# Patient Record
Sex: Female | Born: 1990 | Race: White | Hispanic: No | Marital: Single
Health system: Southern US, Community
[De-identification: ages and names within clinical notes are randomized; demographics above are authoritative.]

---
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
## 2013-03-06 DIAGNOSIS — M549 Dorsalgia, unspecified: Secondary | ICD-10-CM | POA: Insufficient documentation

## 2013-03-06 DIAGNOSIS — Z9109 Other allergy status, other than to drugs and biological substances: Secondary | ICD-10-CM | POA: Insufficient documentation

## 2013-03-06 DIAGNOSIS — G8929 Other chronic pain: Secondary | ICD-10-CM | POA: Insufficient documentation

## 2013-03-06 DIAGNOSIS — G43909 Migraine, unspecified, not intractable, without status migrainosus: Secondary | ICD-10-CM | POA: Insufficient documentation

## 2013-04-08 DIAGNOSIS — R062 Wheezing: Secondary | ICD-10-CM | POA: Insufficient documentation

## 2015-12-25 DIAGNOSIS — E042 Nontoxic multinodular goiter: Secondary | ICD-10-CM | POA: Insufficient documentation

## 2017-03-15 ENCOUNTER — Other Ambulatory Visit: Payer: Self-pay | Admitting: Orthopaedic Surgery

## 2017-03-15 DIAGNOSIS — M542 Cervicalgia: Secondary | ICD-10-CM

## 2017-03-15 DIAGNOSIS — M546 Pain in thoracic spine: Secondary | ICD-10-CM

## 2017-04-03 ENCOUNTER — Ambulatory Visit
Admission: RE | Admit: 2017-04-03 | Discharge: 2017-04-03 | Disposition: A | Discharge status: Home / Self Care | Payer: BLUE CROSS/BLUE SHIELD | Source: Ambulatory Visit | Attending: Orthopaedic Surgery | Admitting: Orthopaedic Surgery

## 2017-04-03 ENCOUNTER — Encounter: Payer: Self-pay | Admitting: Radiology

## 2017-04-03 VITALS — BP 107/67 | HR 62

## 2017-04-03 DIAGNOSIS — M546 Pain in thoracic spine: Secondary | ICD-10-CM

## 2017-04-03 DIAGNOSIS — M4120 Other idiopathic scoliosis, site unspecified: Secondary | ICD-10-CM

## 2017-04-03 DIAGNOSIS — M542 Cervicalgia: Secondary | ICD-10-CM

## 2017-04-03 MED ORDER — DIAZEPAM 5 MG PO TABS
10.0000 mg | ORAL_TABLET | Freq: Once | ORAL | Status: AC
  Administered 2017-04-03: 10 mg via ORAL

## 2017-04-03 MED ORDER — OXYCODONE-ACETAMINOPHEN 5-325 MG PO TABS
1.0000 | ORAL_TABLET | Freq: Once | ORAL | Status: AC
  Administered 2017-04-03: 1 via ORAL

## 2017-04-03 MED ORDER — ONDANSETRON HCL 4 MG/2ML IJ SOLN: 4.0000 mg | Freq: Four times a day (QID) | INTRAMUSCULAR | Status: DC | PRN

## 2017-04-03 MED ORDER — IOPAMIDOL (ISOVUE-M 300) INJECTION 61%
10.0000 mL | Freq: Once | INTRAMUSCULAR | Status: AC | PRN
  Administered 2017-04-03: 10 mL via INTRATHECAL

## 2017-04-03 NOTE — Discharge Instructions (Signed)
Myelogram Discharge Instructions  1. Go home and rest quietly for the next 24 hours.  It is important to lie flat for the next 24 hours.  Get up only to go to the restroom.  You may lie in the bed or on a couch on your back, your stomach, your left side or your right side.  You may have one pillow under your head.  You may have pillows between your knees while you are on your side or under your knees while you are on your back.  2. DO NOT drive today.  Recline the seat as far back as it will go, while still wearing your seat belt, on the way home.  3. You may get up to go to the bathroom as needed.  You may sit up for 10 minutes to eat.  You may resume your normal diet and medications unless otherwise indicated.  Drink lots of extra fluids today and tomorrow.  4. The incidence of headache, nausea, or vomiting is about 5% (one in 20 patients).  If you develop a headache, lie flat and drink plenty of fluids until the headache goes away.  Caffeinated beverages may be helpful.  If you develop severe nausea and vomiting or a headache that does not go away with flat bed rest, call [phone].  5. You may resume normal activities after your 24 hours of bed rest is over; however, do not exert yourself strongly or do any heavy lifting tomorrow. If when you get up you have a headache when standing, go back to bed and force fluids for another 24 hours.  6. Call your physician for a follow-up appointment.  The results of your myelogram will be sent directly to your physician by the following day.  7. If you have any questions or if complications develop after you arrive home, please call [phone].  Discharge instructions have been explained to the patient.  The patient, or the person responsible for the patient, fully understands these instructions.       May resume Maxalt on April 04, 2017, after 9:30 am.

## 2017-04-03 NOTE — Progress Notes (Signed)
Patient states she has been off Maxalt for at least the past two days.  Donell SievertJeanne Nox Talent, RN

## 2018-04-24 IMAGING — CT CT T SPINE W/ CM
2 series · 10 of 28 positions shown, 13 images · non-contrast
Comparison: None.

CLINICAL DATA: Upper thoracic spine pain located between the
shoulder blades. Cervicalgia. Occasional low back pain. Prior
thoracolumbar fusion.
TECHNIQUE: Contiguous axial images were obtained through the Cervical and
Thoracic spine after the intrathecal infusion of infusion. Coronal
and sagittal reconstructions were obtained of the axial image sets.

[Series 3: t spine soft · axial · 0.32mm/px · z∈[-439,-250]mm · 5 of 91 slices shown, 7 images]
[im 14/91  soft-tissue]
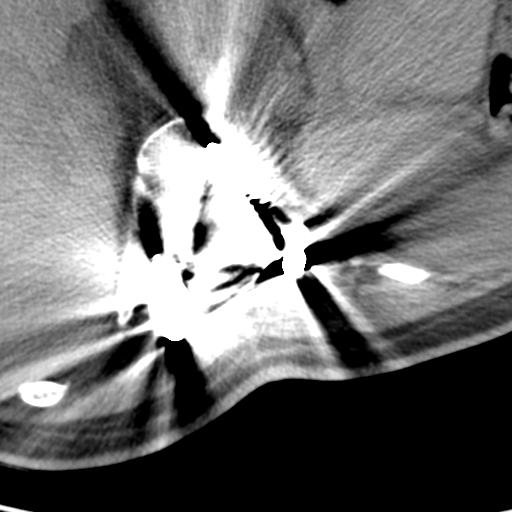
[im 14/91  bone]
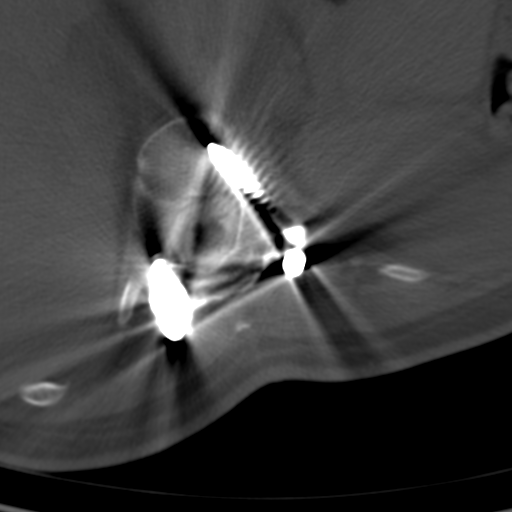
[im 28/91  bone]
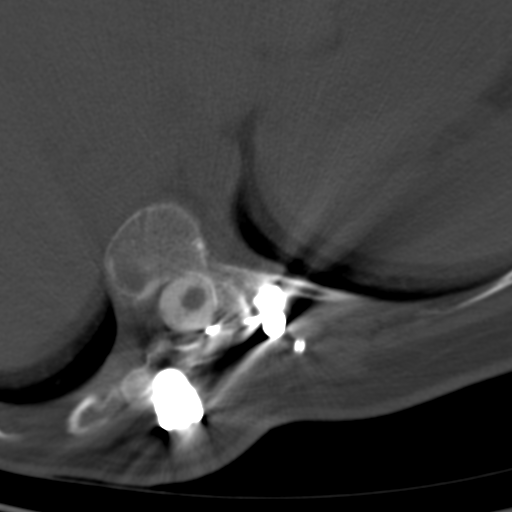
[im 49/91  bone]
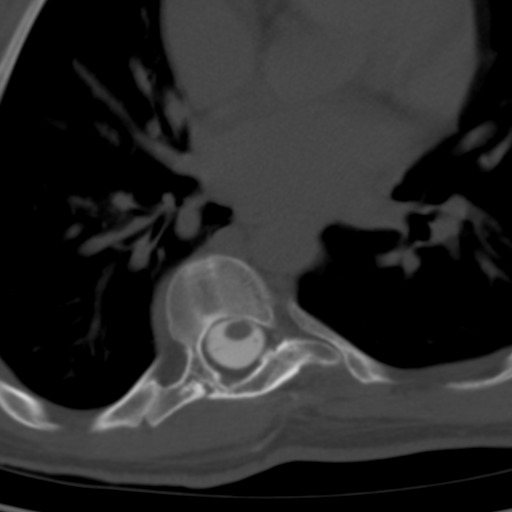
[im 63/91  bone]
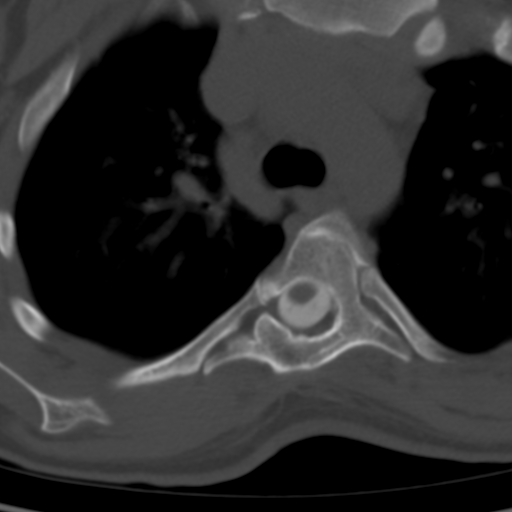
[im 77/91  soft-tissue]
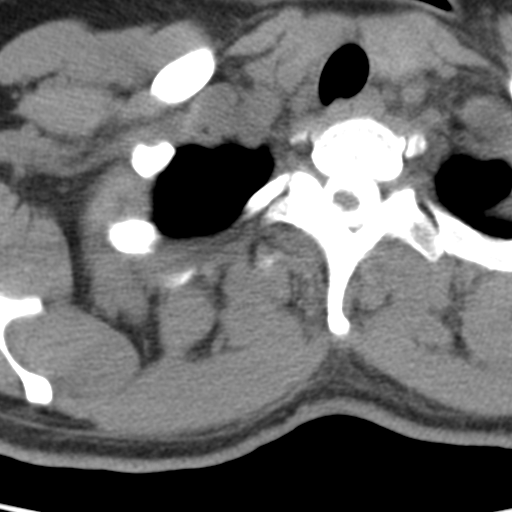
[im 77/91  bone]
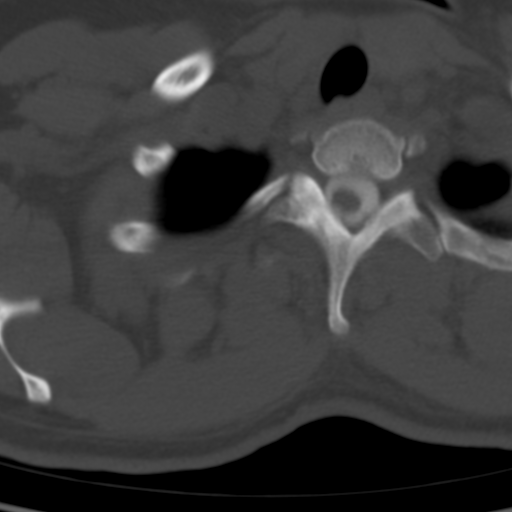

[Series 9: sag upper · sagittal · 0.33mm/px · 5 of 49 slices shown, 6 images]
[im 18/49  bone]
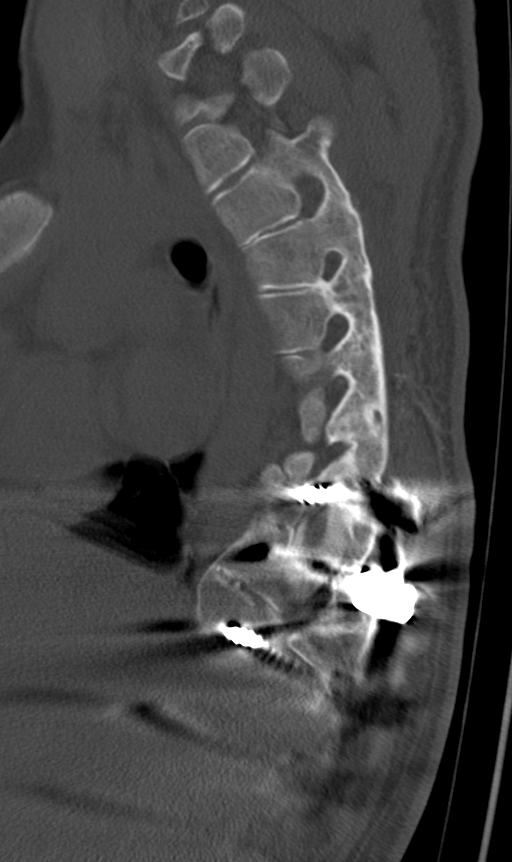
[im 21/49  bone]
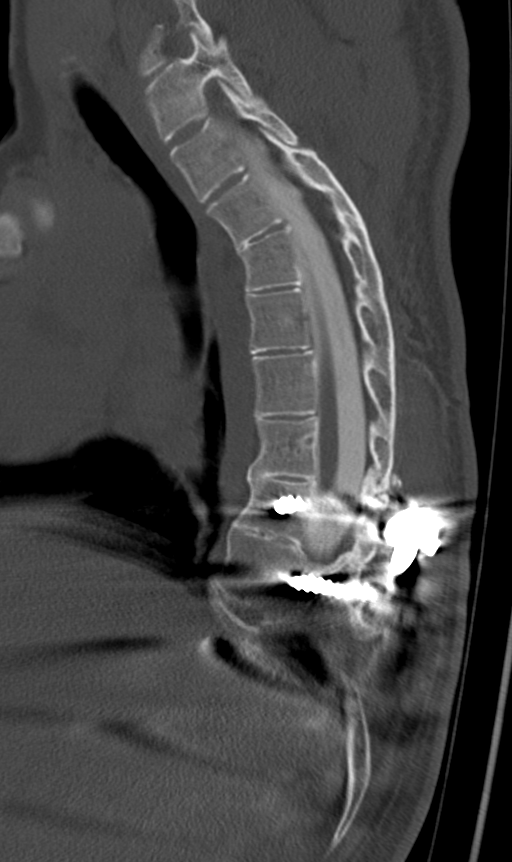
[im 25/49  soft-tissue]
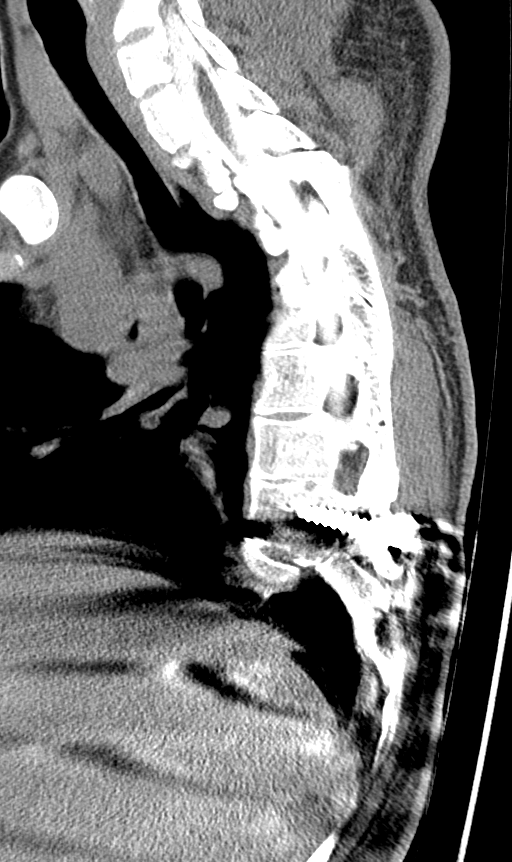
[im 25/49  bone]
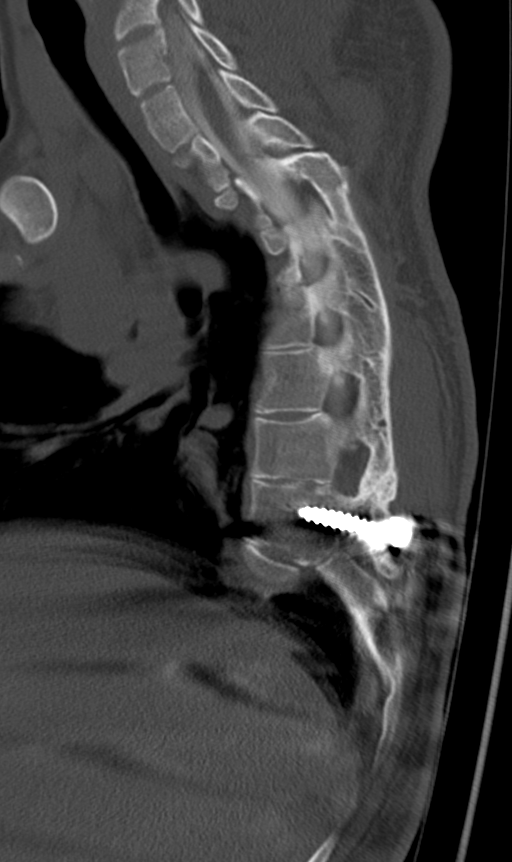
[im 28/49  bone]
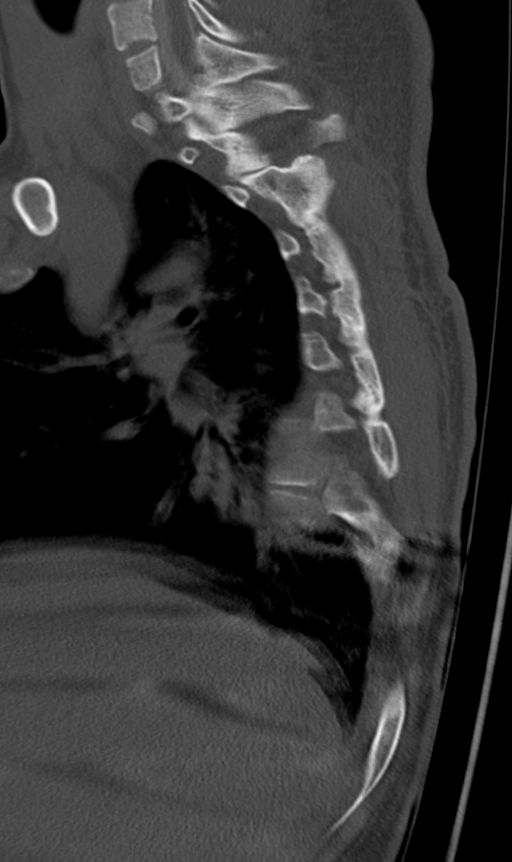
[im 31/49  bone]
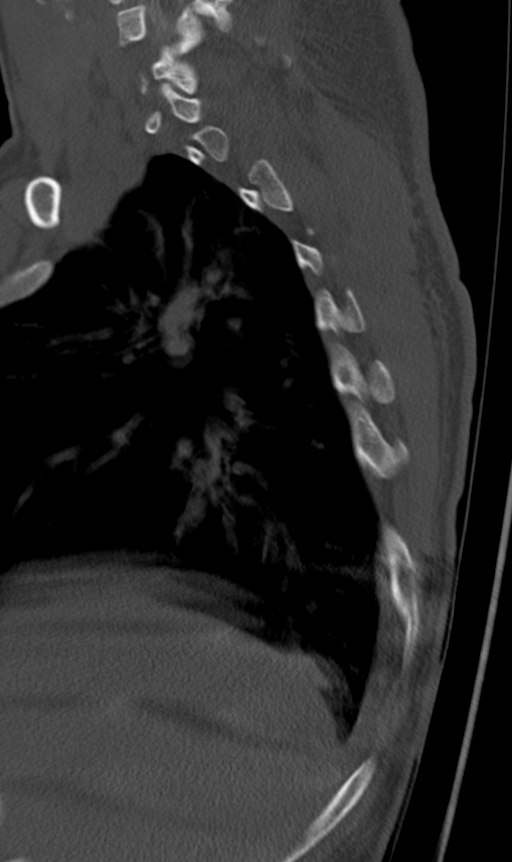

[10 of 28 positions shown; findings below may reference images not displayed]

FLUOROSCOPY TIME:  Radiation Exposure Index (as provided by the
fluoroscopic device): 501.21 microGray*m^2

Fluoroscopy Time (in minutes and seconds):  51 seconds

PROCEDURE:
LUMBAR PUNCTURE FOR CERVICAL AND THORACIC MYELOGRAM

After thorough discussion of risks and benefits of the procedure
including bleeding, infection, injury to nerves, blood vessels,
adjacent structures as well as headache and CSF leak, written and
oral informed consent was obtained. Consent was obtained by Dr.
Kasija Bernadette.

Patient was positioned prone on the fluoroscopy table. Local
anesthesia was provided with 1% lidocaine without epinephrine after
prepped and draped in the usual sterile fashion. Puncture was
performed at L3-4 using a 3 1/2 inch 22-gauge spinal needle via a
left interlaminar approach. Using a single pass through the dura,
the needle was placed within the thecal sac, with return of clear
CSF. 10 mL of Isovue-M 300 was injected into the thecal sac, with
normal opacification of the nerve roots and cauda equina consistent
with free flow within the subarachnoid space. The patient was then
moved to the trendelenburg position and contrast flowed into the
Thoracic spine region.

I personally performed the lumbar puncture and administered the
intrathecal contrast. I also personally supervised acquisition of
the myelogram images.
FINDINGS: CERVICAL AND THORACIC MYELOGRAM FINDINGS:

There is transitional lumbosacral anatomy with sacralization of L5.
Extensive posterior thoracolumbar spinal fusion has been performed.
Pedicle screws and interconnecting rods are in place from T8-L3.
Cervical vertebral alignment is normal. There is a severe thoracic
dextroscoliosis. The thecal sac appears widely patent in the
thoracic spine. Detailed assessment of the cervical spine is
deferred to the accompanying CT given relatively dilute contrast in
this region on the conventional myelographic images.

CT CERVICAL MYELOGRAM FINDINGS:

There is mild straightening of the normal cervical lordosis. There
is no significant listhesis. No fracture or destructive osseous
process is identified. Cervical spinal cord morphology is normal.
Paraspinal soft tissues are unremarkable.

There is minimal disc bulging and minimal endplate spurring at C5-6
without resultant spinal stenosis, neural foraminal stenosis, or
spinal cord mass effect. Other cervical disc space levels are
unremarkable.

CT THORACIC MYELOGRAM FINDINGS:

There is severe thoracic dextroscoliosis with apex at T8. Extensive
posterior thoracolumbar spinal fusion has been performed. There is
solid posterior element osseous fusion from T3 inferiorly throughout
the thoracic spine and extending into the lumbar spine (only imaged
to L1). Bilateral pedicle screws and interconnecting rods are
present from T8-L3 (L2 and L3 not included on this CT). There is no
evidence of pedicle screw loosening. Cerclage wires are noted around
the left lamina and rods at T9 and T10. Thoracic spinal cord
morphology is normal, and the conus medullaris terminates at T12.
The paraspinal soft tissues are unremarkable.

The thoracic spinal canal is widely patent, and no thoracic disc
herniation is identified. There is mild right facet arthrosis at
T1-2 resulting in mild right neural foraminal narrowing. Moderate
right facet arthrosis at T2-3 results in moderate right neural
foraminal stenosis.
IMPRESSION: 1. Minimal disc bulging at C5-6.  No cervical spinal stenosis.
2. Severe thoracolumbar dextroscoliosis. Solid posterior
thoracolumbar fusion (as imaged through the L1 level).
3. Widely patent thoracic spinal canal.
4. Right-sided upper thoracic facet arthrosis with mild right neural
foraminal narrowing at T1-2 and moderate right neural foraminal
narrowing at T2-3.

## 2018-04-24 IMAGING — CT CT CERVICAL SPINE W/ CM
2 series · 10 of 14 positions shown, 12 images · non-contrast
Comparison: None.

CLINICAL DATA: Upper thoracic spine pain located between the
shoulder blades. Cervicalgia. Occasional low back pain. Prior
thoracolumbar fusion.
TECHNIQUE: Contiguous axial images were obtained through the Cervical and
Thoracic spine after the intrathecal infusion of infusion. Coronal
and sagittal reconstructions were obtained of the axial image sets.

[Series 4: cspine soft · axial · 0.32mm/px · z∈[-246,-118]mm · 5 of 98 slices shown]
[im 17/98  soft-tissue]
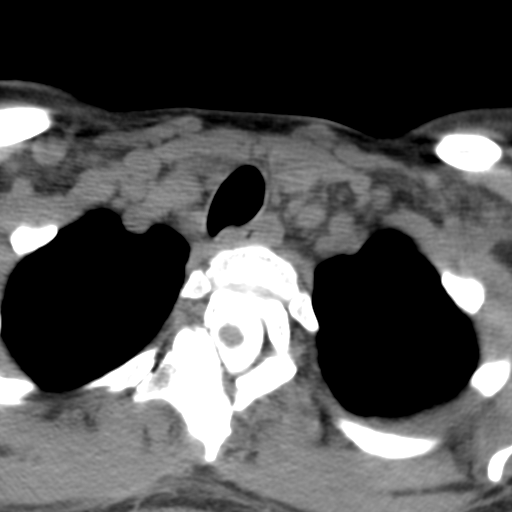
[im 33/98  soft-tissue]
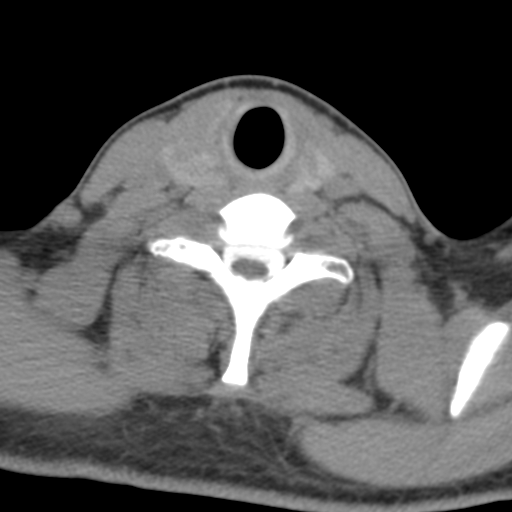
[im 49/98  soft-tissue]
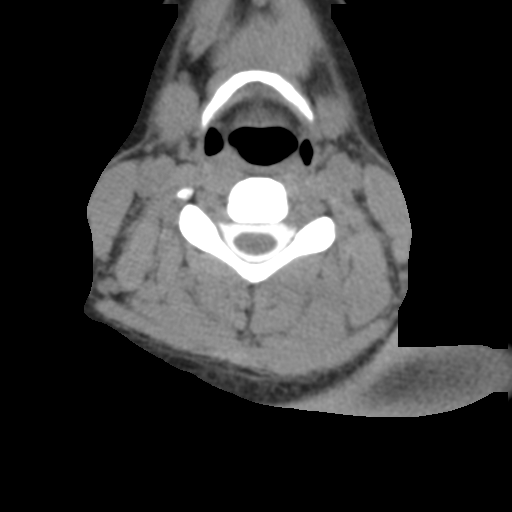
[im 65/98  soft-tissue]
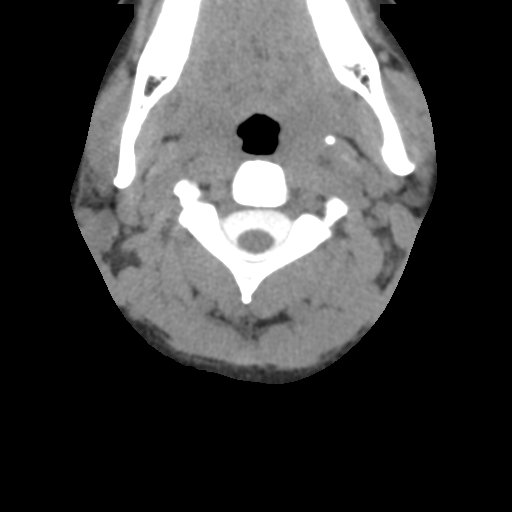
[im 81/98  soft-tissue]
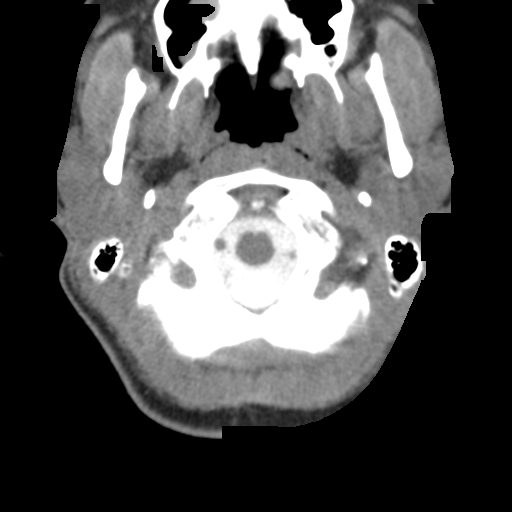

[Series 10: angled axial · axial · 0.26mm/px · z∈[-254,-125]mm · 5 of 99 slices shown, 7 images]
[im 17/99  soft-tissue]
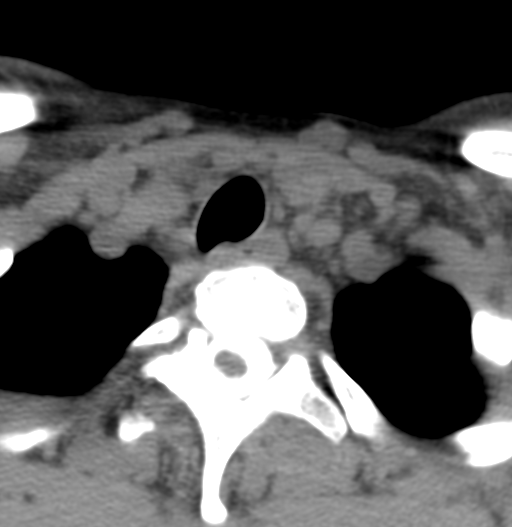
[im 17/99  bone]
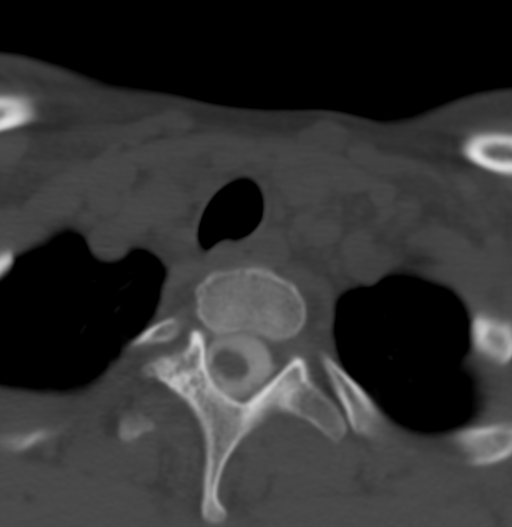
[im 33/99  bone]
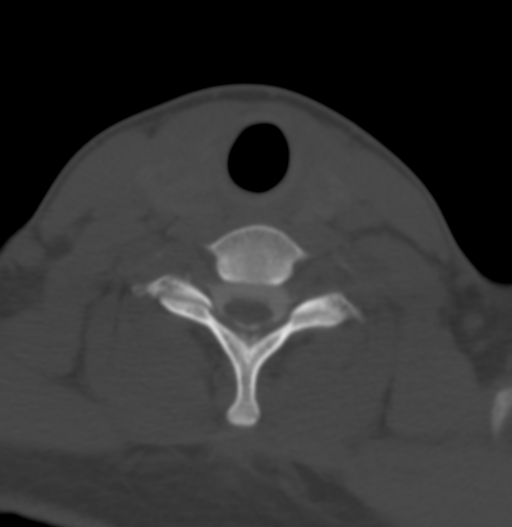
[im 50/99  bone]
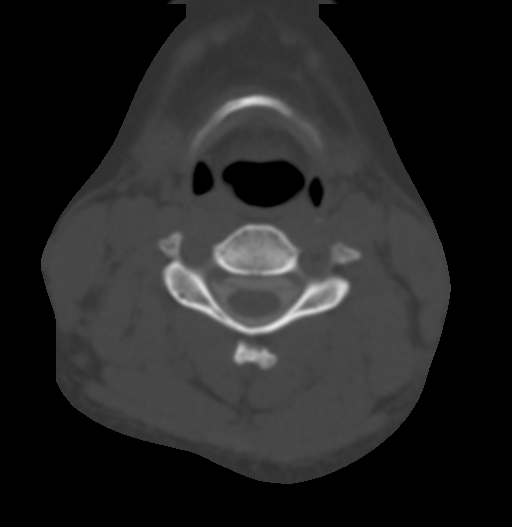
[im 66/99  bone]
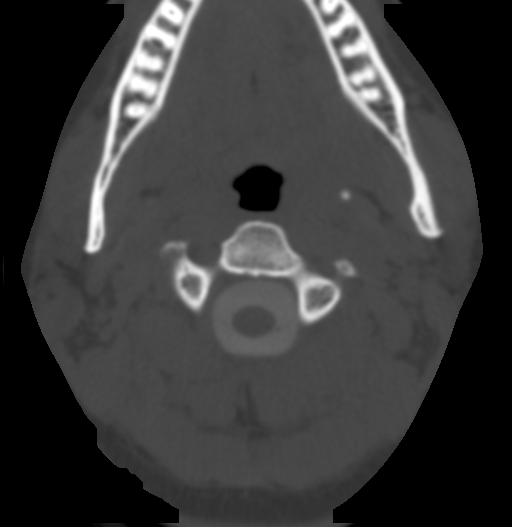
[im 82/99  soft-tissue]
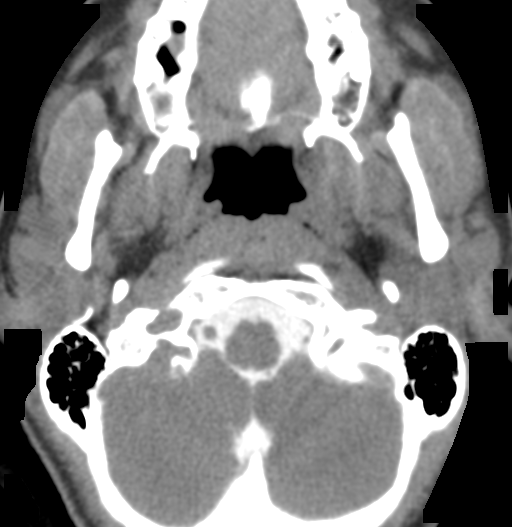
[im 82/99  bone]
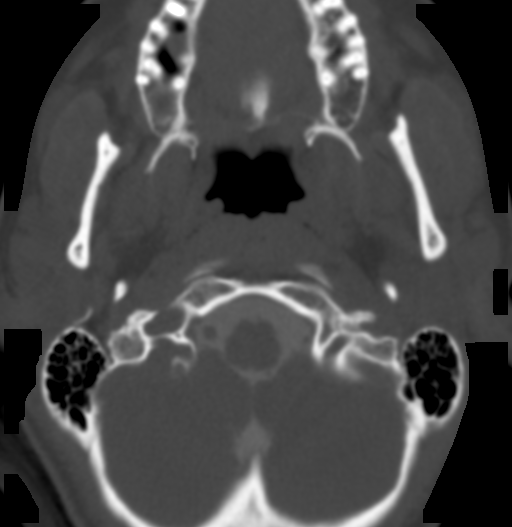

[10 of 14 positions shown; findings below may reference images not displayed]

FLUOROSCOPY TIME:  Radiation Exposure Index (as provided by the
fluoroscopic device): 501.21 microGray*m^2

Fluoroscopy Time (in minutes and seconds):  51 seconds

PROCEDURE:
LUMBAR PUNCTURE FOR CERVICAL AND THORACIC MYELOGRAM

After thorough discussion of risks and benefits of the procedure
including bleeding, infection, injury to nerves, blood vessels,
adjacent structures as well as headache and CSF leak, written and
oral informed consent was obtained. Consent was obtained by Dr.
Kasija Bernadette.

Patient was positioned prone on the fluoroscopy table. Local
anesthesia was provided with 1% lidocaine without epinephrine after
prepped and draped in the usual sterile fashion. Puncture was
performed at L3-4 using a 3 1/2 inch 22-gauge spinal needle via a
left interlaminar approach. Using a single pass through the dura,
the needle was placed within the thecal sac, with return of clear
CSF. 10 mL of Isovue-M 300 was injected into the thecal sac, with
normal opacification of the nerve roots and cauda equina consistent
with free flow within the subarachnoid space. The patient was then
moved to the trendelenburg position and contrast flowed into the
Thoracic spine region.

I personally performed the lumbar puncture and administered the
intrathecal contrast. I also personally supervised acquisition of
the myelogram images.
FINDINGS: CERVICAL AND THORACIC MYELOGRAM FINDINGS:

There is transitional lumbosacral anatomy with sacralization of L5.
Extensive posterior thoracolumbar spinal fusion has been performed.
Pedicle screws and interconnecting rods are in place from T8-L3.
Cervical vertebral alignment is normal. There is a severe thoracic
dextroscoliosis. The thecal sac appears widely patent in the
thoracic spine. Detailed assessment of the cervical spine is
deferred to the accompanying CT given relatively dilute contrast in
this region on the conventional myelographic images.

CT CERVICAL MYELOGRAM FINDINGS:

There is mild straightening of the normal cervical lordosis. There
is no significant listhesis. No fracture or destructive osseous
process is identified. Cervical spinal cord morphology is normal.
Paraspinal soft tissues are unremarkable.

There is minimal disc bulging and minimal endplate spurring at C5-6
without resultant spinal stenosis, neural foraminal stenosis, or
spinal cord mass effect. Other cervical disc space levels are
unremarkable.

CT THORACIC MYELOGRAM FINDINGS:

There is severe thoracic dextroscoliosis with apex at T8. Extensive
posterior thoracolumbar spinal fusion has been performed. There is
solid posterior element osseous fusion from T3 inferiorly throughout
the thoracic spine and extending into the lumbar spine (only imaged
to L1). Bilateral pedicle screws and interconnecting rods are
present from T8-L3 (L2 and L3 not included on this CT). There is no
evidence of pedicle screw loosening. Cerclage wires are noted around
the left lamina and rods at T9 and T10. Thoracic spinal cord
morphology is normal, and the conus medullaris terminates at T12.
The paraspinal soft tissues are unremarkable.

The thoracic spinal canal is widely patent, and no thoracic disc
herniation is identified. There is mild right facet arthrosis at
T1-2 resulting in mild right neural foraminal narrowing. Moderate
right facet arthrosis at T2-3 results in moderate right neural
foraminal stenosis.
IMPRESSION: 1. Minimal disc bulging at C5-6.  No cervical spinal stenosis.
2. Severe thoracolumbar dextroscoliosis. Solid posterior
thoracolumbar fusion (as imaged through the L1 level).
3. Widely patent thoracic spinal canal.
4. Right-sided upper thoracic facet arthrosis with mild right neural
foraminal narrowing at T1-2 and moderate right neural foraminal
narrowing at T2-3.

## 2018-04-24 IMAGING — XA DG MYELOGRAPHY LUMBAR INJ MULTI REGION
12 of 16 series · 12 of 16 positions shown · non-contrast
Comparison: None.

CLINICAL DATA: Upper thoracic spine pain located between the
shoulder blades. Cervicalgia. Occasional low back pain. Prior
thoracolumbar fusion.
TECHNIQUE: Contiguous axial images were obtained through the Cervical and
Thoracic spine after the intrathecal infusion of infusion. Coronal
and sagittal reconstructions were obtained of the axial image sets.

[Series 1: vasc standard · 1 of 1 slices shown (1 of 12)]
[im 1/1]
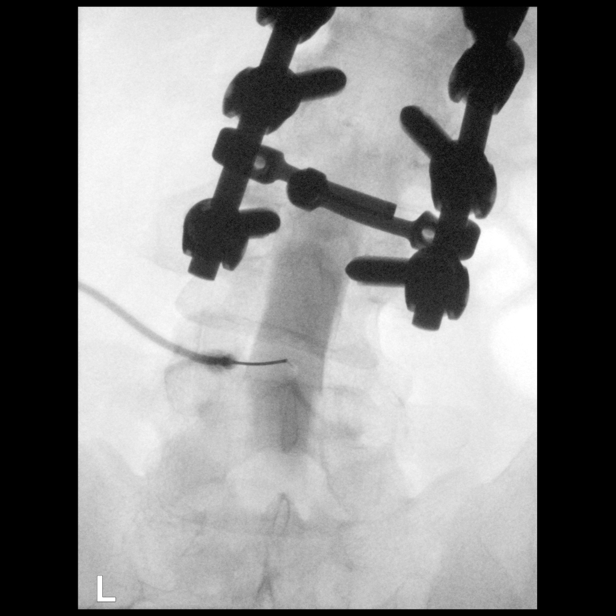

[Series 3: vasc standard · 1 of 1 slices shown (2 of 12)]
[im 1/1]
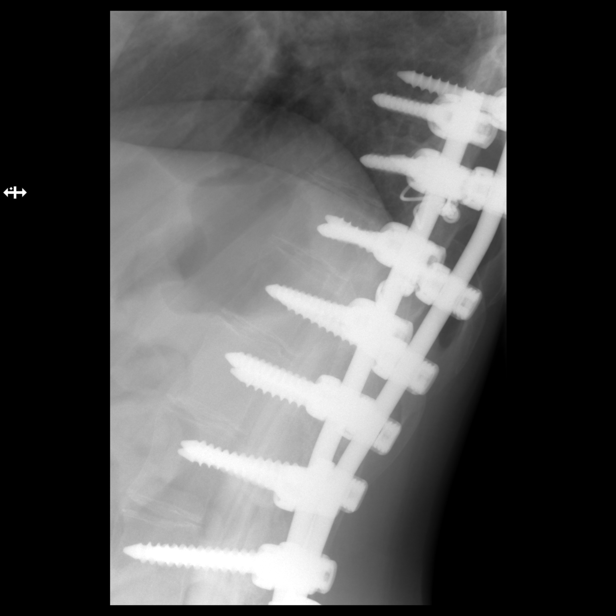

[Series 4: vasc standard · 1 of 1 slices shown (3 of 12)]
[im 1/1]
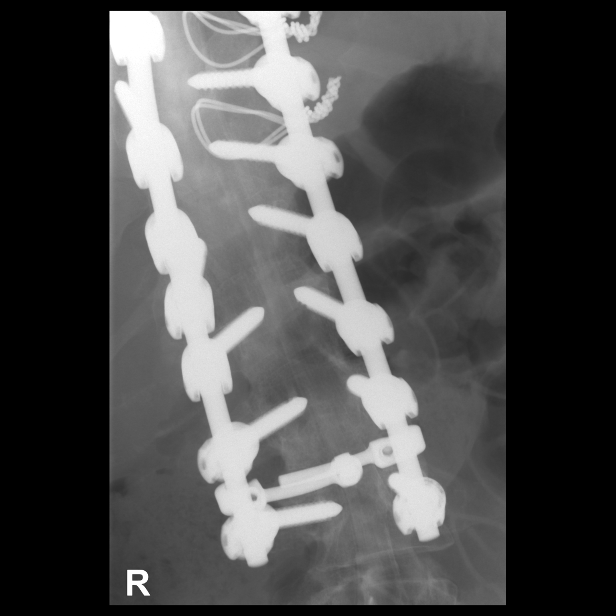

[Series 5: vasc standard · 1 of 1 slices shown (4 of 12)]
[im 1/1]
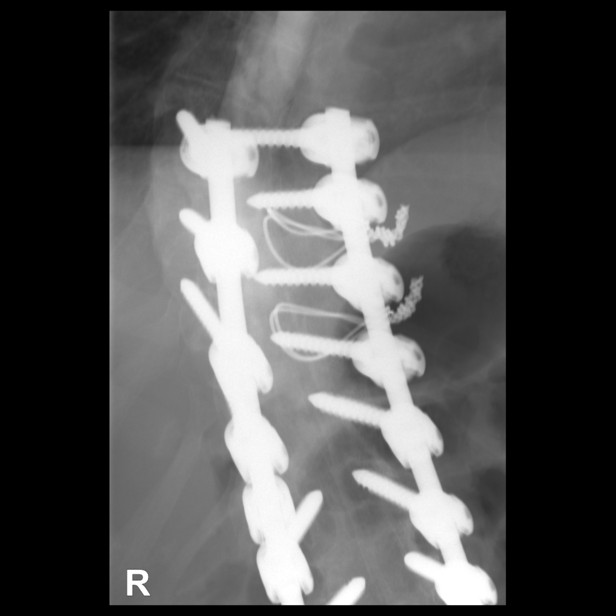

[Series 7: vasc standard · 1 of 1 slices shown (5 of 12)]
[im 1/1]
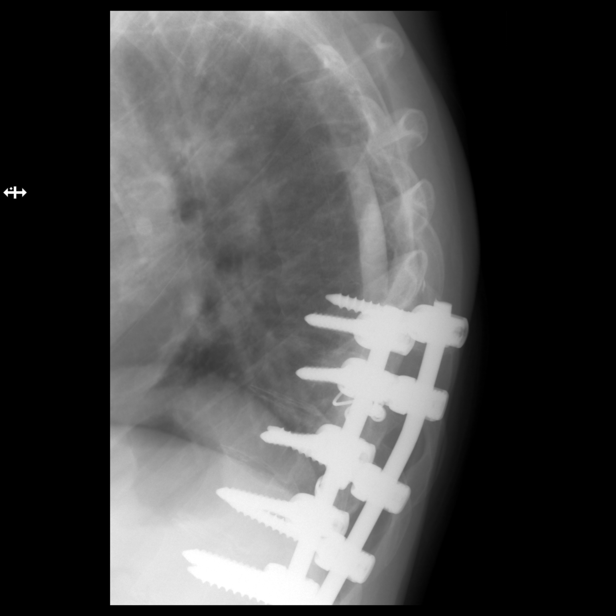

[Series 8: vasc standard · 1 of 1 slices shown (6 of 12)]
[im 1/1]
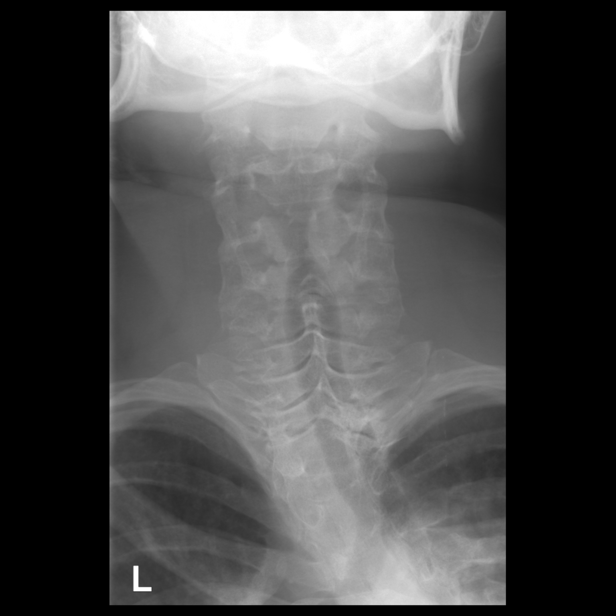

[Series 9: vasc standard · 1 of 1 slices shown (7 of 12)]
[im 1/1]
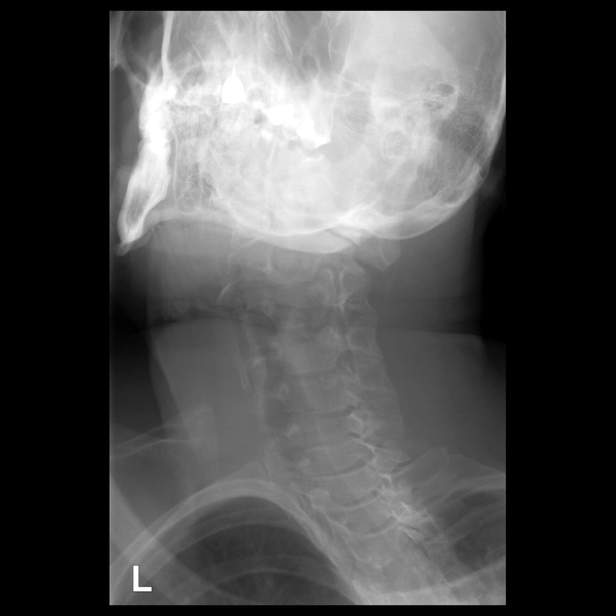

[Series 11: vasc standard · 1 of 1 slices shown (8 of 12)]
[im 1/1]
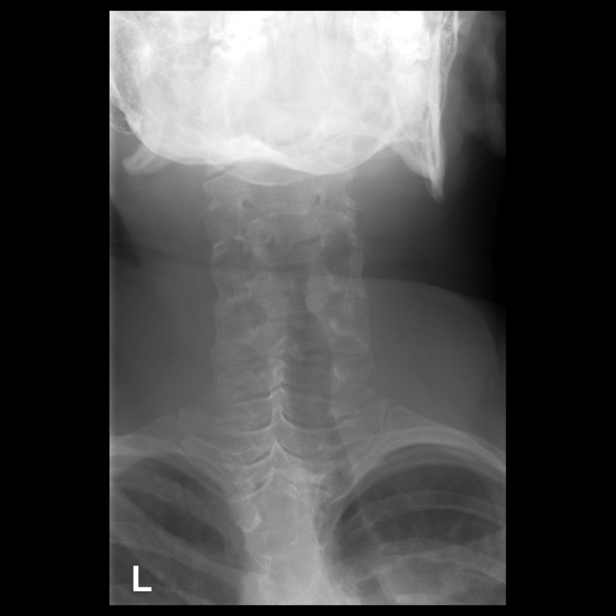

[Series 12: vasc standard · 1 of 1 slices shown (9 of 12)]
[im 1/1]
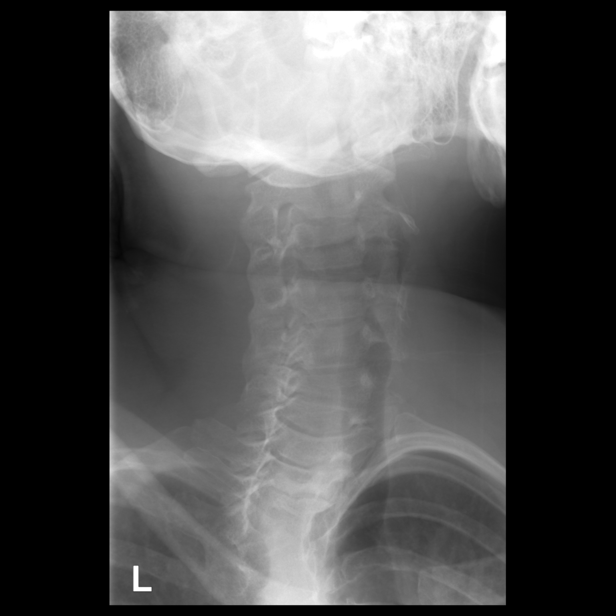

[Series 13: vasc standard · 1 of 1 slices shown (10 of 12)]
[im 1/1]
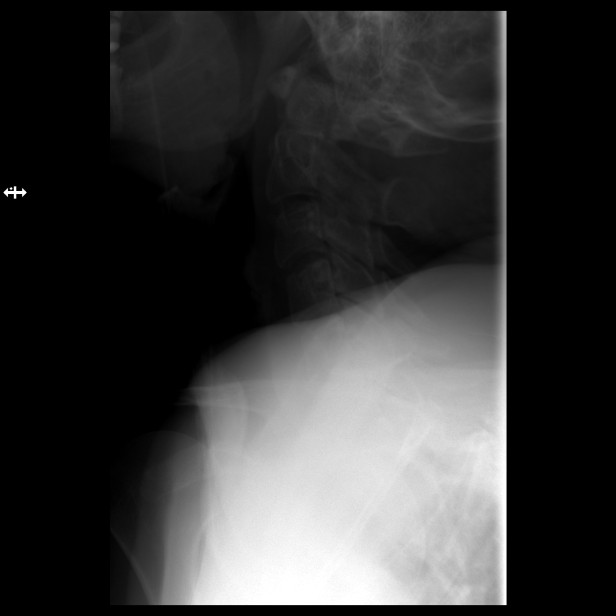

[Series 15: vasc standard · 1 of 1 slices shown (11 of 12)]
[im 1/1]
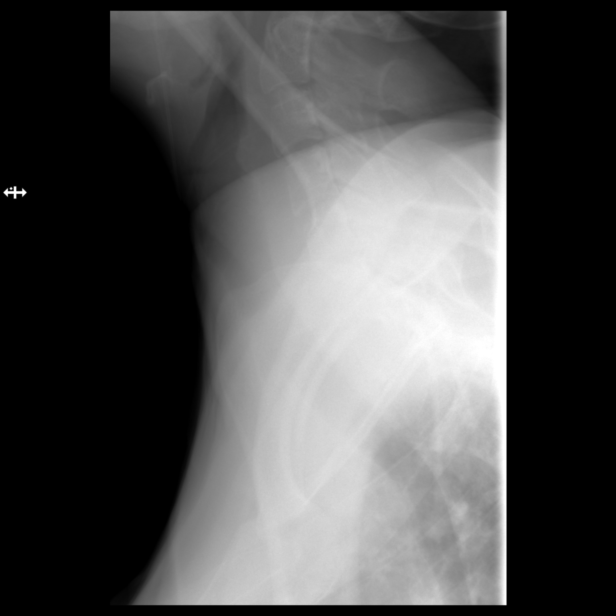

[Series 16: vasc standard · 1 of 1 slices shown (12 of 12)]
[im 1/1]
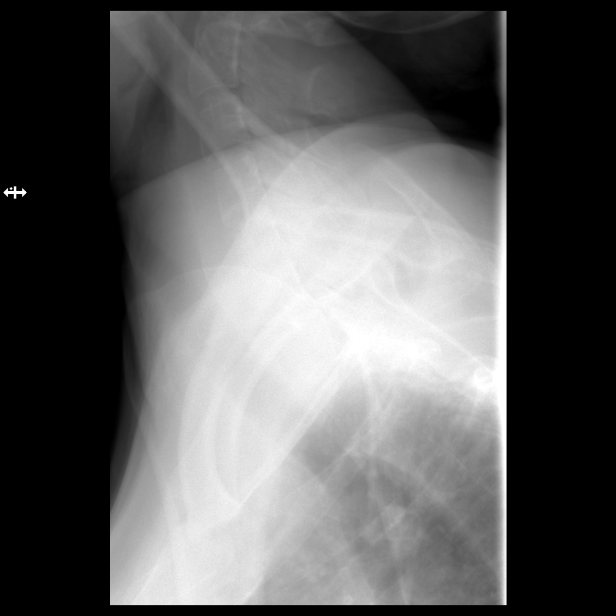

[12 of 16 positions shown; findings below may reference images not displayed]

FLUOROSCOPY TIME:  Radiation Exposure Index (as provided by the
fluoroscopic device): 501.21 microGray*m^2

Fluoroscopy Time (in minutes and seconds):  51 seconds

PROCEDURE:
LUMBAR PUNCTURE FOR CERVICAL AND THORACIC MYELOGRAM

After thorough discussion of risks and benefits of the procedure
including bleeding, infection, injury to nerves, blood vessels,
adjacent structures as well as headache and CSF leak, written and
oral informed consent was obtained. Consent was obtained by Dr.
Kasija Bernadette.

Patient was positioned prone on the fluoroscopy table. Local
anesthesia was provided with 1% lidocaine without epinephrine after
prepped and draped in the usual sterile fashion. Puncture was
performed at L3-4 using a 3 1/2 inch 22-gauge spinal needle via a
left interlaminar approach. Using a single pass through the dura,
the needle was placed within the thecal sac, with return of clear
CSF. 10 mL of Isovue-M 300 was injected into the thecal sac, with
normal opacification of the nerve roots and cauda equina consistent
with free flow within the subarachnoid space. The patient was then
moved to the trendelenburg position and contrast flowed into the
Thoracic spine region.

I personally performed the lumbar puncture and administered the
intrathecal contrast. I also personally supervised acquisition of
the myelogram images.
FINDINGS: CERVICAL AND THORACIC MYELOGRAM FINDINGS:

There is transitional lumbosacral anatomy with sacralization of L5.
Extensive posterior thoracolumbar spinal fusion has been performed.
Pedicle screws and interconnecting rods are in place from T8-L3.
Cervical vertebral alignment is normal. There is a severe thoracic
dextroscoliosis. The thecal sac appears widely patent in the
thoracic spine. Detailed assessment of the cervical spine is
deferred to the accompanying CT given relatively dilute contrast in
this region on the conventional myelographic images.

CT CERVICAL MYELOGRAM FINDINGS:

There is mild straightening of the normal cervical lordosis. There
is no significant listhesis. No fracture or destructive osseous
process is identified. Cervical spinal cord morphology is normal.
Paraspinal soft tissues are unremarkable.

There is minimal disc bulging and minimal endplate spurring at C5-6
without resultant spinal stenosis, neural foraminal stenosis, or
spinal cord mass effect. Other cervical disc space levels are
unremarkable.

CT THORACIC MYELOGRAM FINDINGS:

There is severe thoracic dextroscoliosis with apex at T8. Extensive
posterior thoracolumbar spinal fusion has been performed. There is
solid posterior element osseous fusion from T3 inferiorly throughout
the thoracic spine and extending into the lumbar spine (only imaged
to L1). Bilateral pedicle screws and interconnecting rods are
present from T8-L3 (L2 and L3 not included on this CT). There is no
evidence of pedicle screw loosening. Cerclage wires are noted around
the left lamina and rods at T9 and T10. Thoracic spinal cord
morphology is normal, and the conus medullaris terminates at T12.
The paraspinal soft tissues are unremarkable.

The thoracic spinal canal is widely patent, and no thoracic disc
herniation is identified. There is mild right facet arthrosis at
T1-2 resulting in mild right neural foraminal narrowing. Moderate
right facet arthrosis at T2-3 results in moderate right neural
foraminal stenosis.
IMPRESSION: 1. Minimal disc bulging at C5-6.  No cervical spinal stenosis.
2. Severe thoracolumbar dextroscoliosis. Solid posterior
thoracolumbar fusion (as imaged through the L1 level).
3. Widely patent thoracic spinal canal.
4. Right-sided upper thoracic facet arthrosis with mild right neural
foraminal narrowing at T1-2 and moderate right neural foraminal
narrowing at T2-3.
